# Patient Record
Sex: Female | Born: 2005 | Hispanic: No | Marital: Single
Health system: Southern US, Community
[De-identification: ages and names within clinical notes are randomized; demographics above are authoritative.]

## PROBLEM LIST (undated history)

## (undated) DIAGNOSIS — Z8489 Family history of other specified conditions: Secondary | ICD-10-CM

---
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
## 2024-05-22 ENCOUNTER — Emergency Department (HOSPITAL_COMMUNITY)
Admission: EM | Admit: 2024-05-22 | Discharge: 2024-05-22 | Discharge status: Against Medical Advice | Attending: Emergency Medicine | Admitting: Emergency Medicine

## 2024-05-22 DIAGNOSIS — Z5321 Procedure and treatment not carried out due to patient leaving prior to being seen by health care provider: Secondary | ICD-10-CM | POA: Diagnosis not present

## 2024-05-22 DIAGNOSIS — X58XXXA Exposure to other specified factors, initial encounter: Secondary | ICD-10-CM | POA: Diagnosis not present

## 2024-05-22 DIAGNOSIS — S6991XA Unspecified injury of right wrist, hand and finger(s), initial encounter: Secondary | ICD-10-CM | POA: Diagnosis present

## 2024-05-22 DIAGNOSIS — Y9323 Activity, snow (alpine) (downhill) skiing, snow boarding, sledding, tobogganing and snow tubing: Secondary | ICD-10-CM | POA: Diagnosis not present

## 2024-05-22 NOTE — ED Notes (Signed)
Called x2 for triage with no answer. 

## 2024-05-22 NOTE — ED Triage Notes (Signed)
 Quick triage note: Pt reports right hand injury yesterday when snowboarding. Full ROM noted.
